# Patient Record
Sex: Male | Born: 1965 | Hispanic: No | Marital: Married | Smoking: Former smoker
Health system: Southern US, Community
[De-identification: ages and names within clinical notes are randomized; demographics above are authoritative.]

## PROBLEM LIST (undated history)

## (undated) DIAGNOSIS — D125 Benign neoplasm of sigmoid colon: Secondary | ICD-10-CM

## (undated) DIAGNOSIS — Z1211 Encounter for screening for malignant neoplasm of colon: Secondary | ICD-10-CM

## (undated) DIAGNOSIS — N200 Calculus of kidney: Secondary | ICD-10-CM

## (undated) HISTORY — PX: NO PAST SURGERIES: SHX2092

## (undated) HISTORY — DX: Encounter for screening for malignant neoplasm of colon: Z12.11

## (undated) HISTORY — DX: Benign neoplasm of sigmoid colon: D12.5

---

## 2006-02-06 ENCOUNTER — Ambulatory Visit: Payer: Self-pay

## 2006-10-25 ENCOUNTER — Emergency Department: Payer: Self-pay | Admitting: Emergency Medicine

## 2008-09-03 IMAGING — CT CT STONE STUDY
1 of 2 series · 16 of 32 positions shown, 20 images · non-contrast
Comparison: none

REASON FOR EXAM: severe pain, LLQ, L flank, no hematuria, evaluate for
stone
COMMENTS:

PROCEDURE:     CT  - CT ABDOMEN /PELVIS WO (STONE)  - October 26, 2006  [DATE]
RESULT:
HISTORY: Pain.

[Series 2: stone · axial · 0.72mm/px · z∈[-152,+272]mm · 16 of 153 slices shown, 20 images]
[im 6/153  soft-tissue]
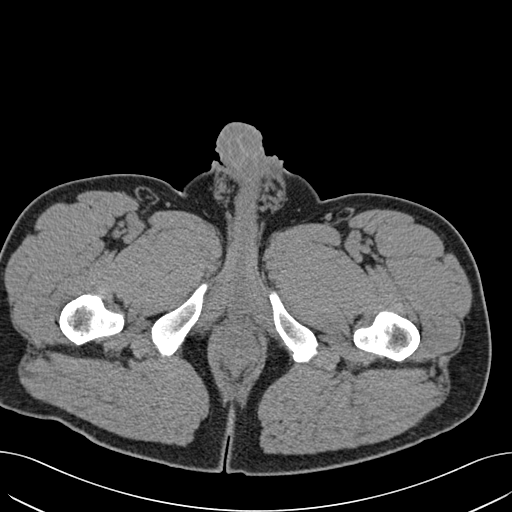
[im 6/153  bone]
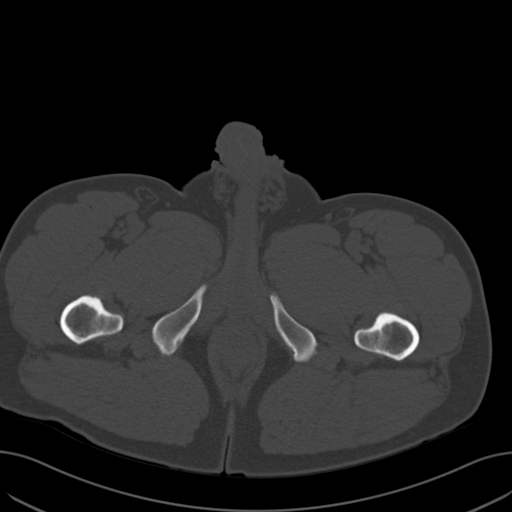
[im 17/153  soft-tissue]
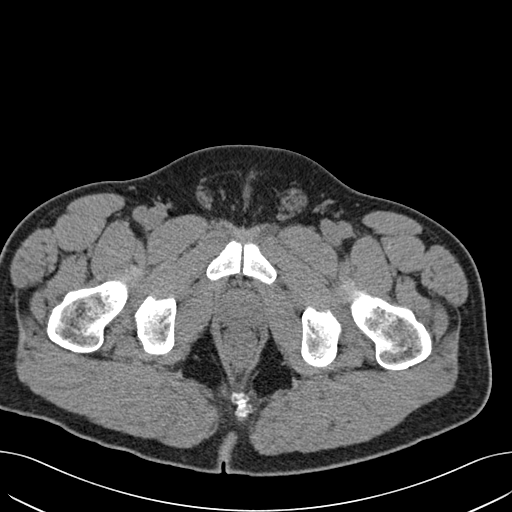
[im 28/153  soft-tissue]
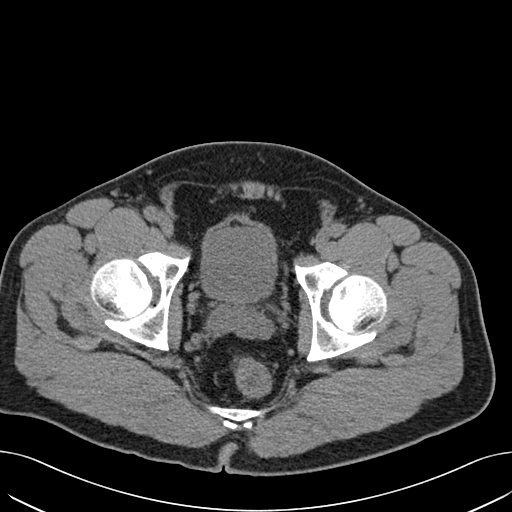
[im 39/153  soft-tissue]
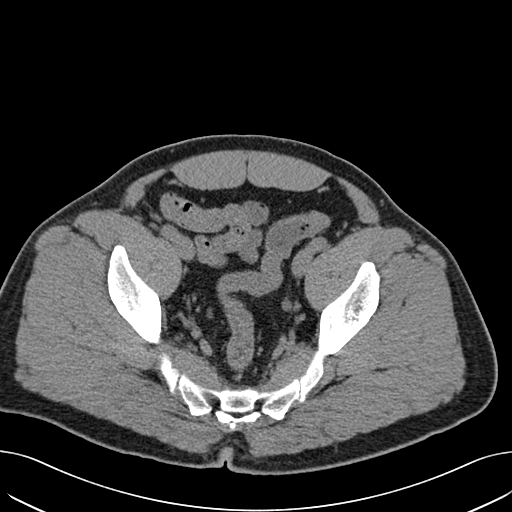
[im 49/153  soft-tissue]
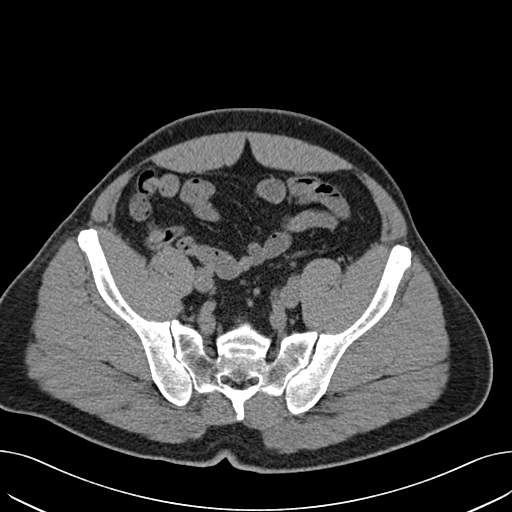
[im 60/153  soft-tissue]
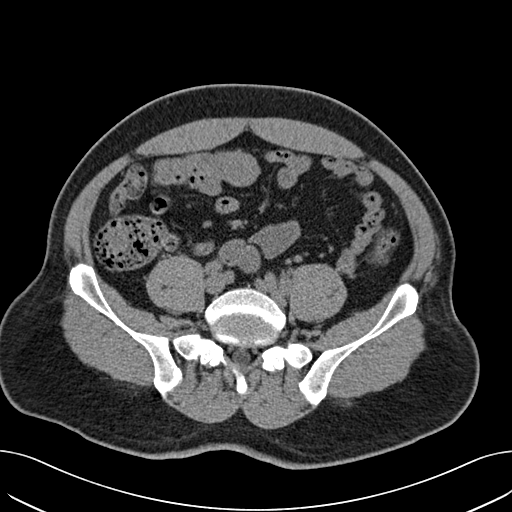
[im 71/153  soft-tissue]
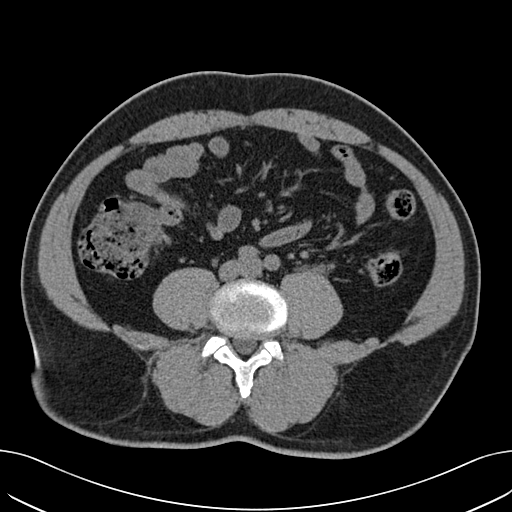
[im 82/153  soft-tissue]
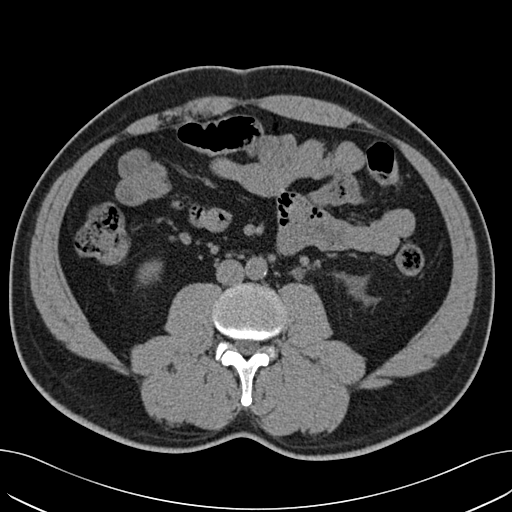
[im 93/153  soft-tissue]
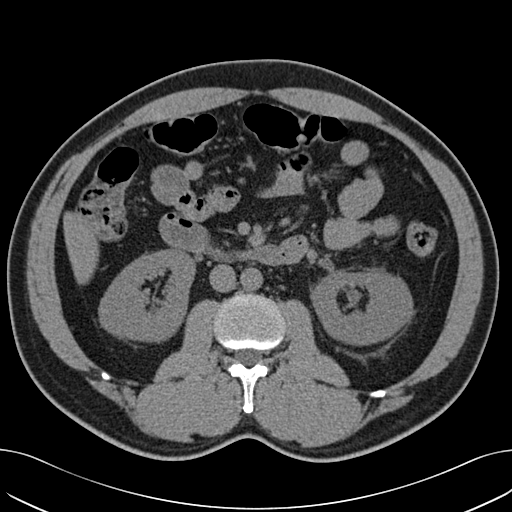
[im 93/153  bone]
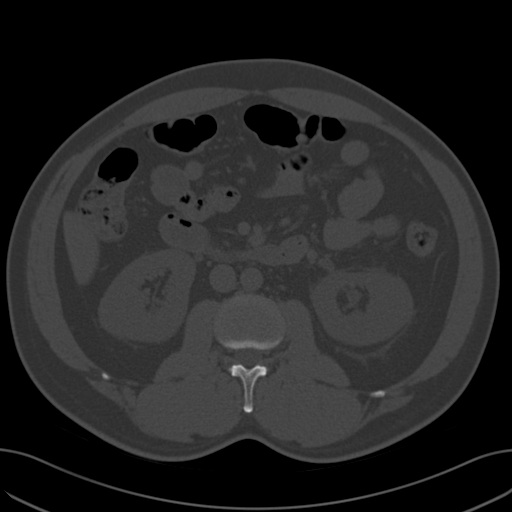
[im 104/153  soft-tissue]
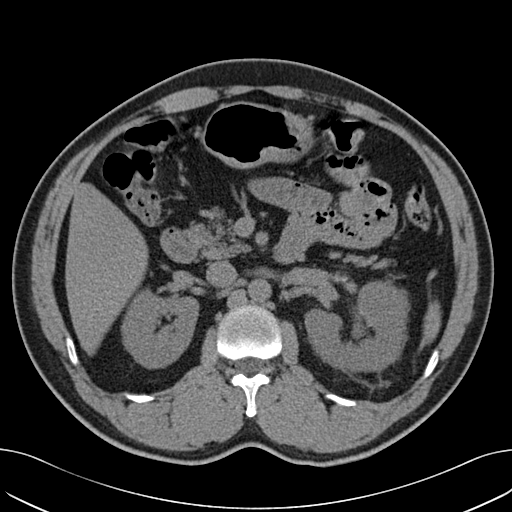
[im 115/153  soft-tissue]
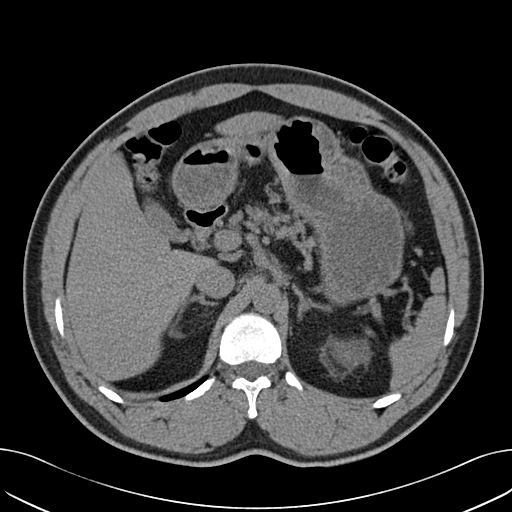
[im 125/153  soft-tissue]
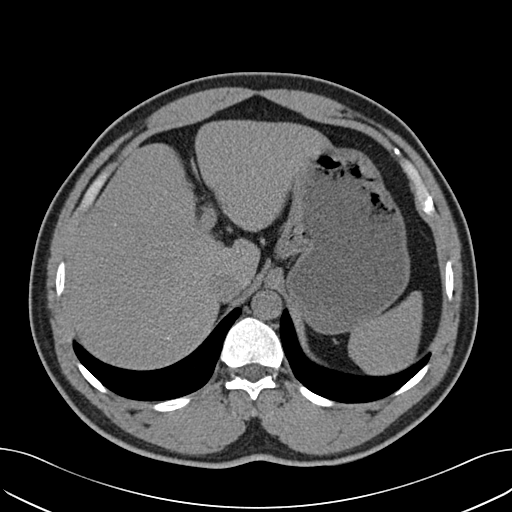
[im 131/153  lung]
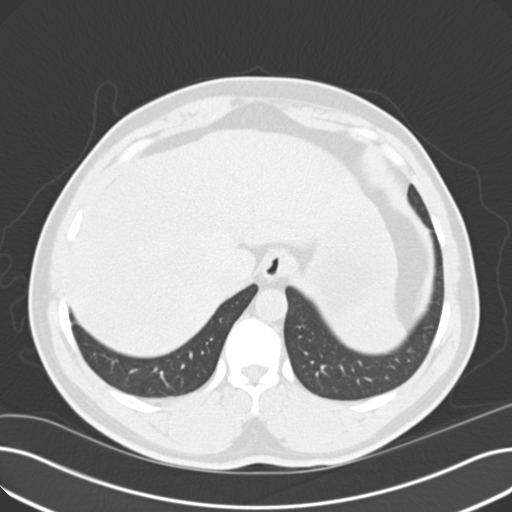
[im 136/153  soft-tissue]
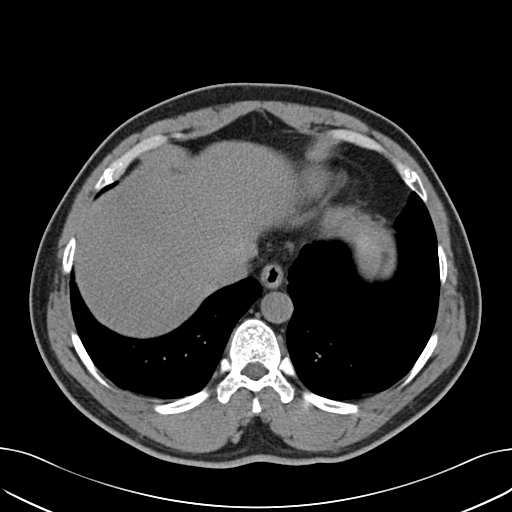
[im 136/153  lung]
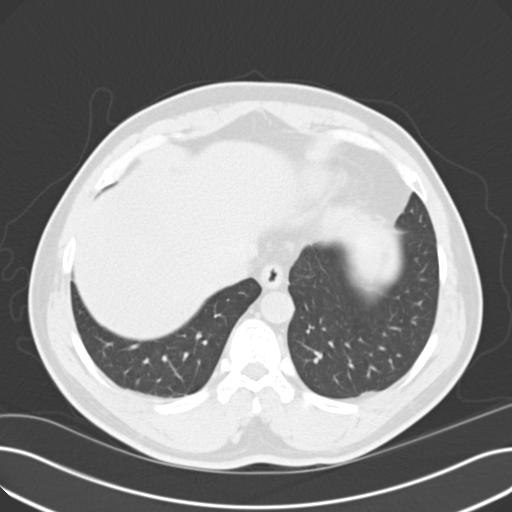
[im 142/153  lung]
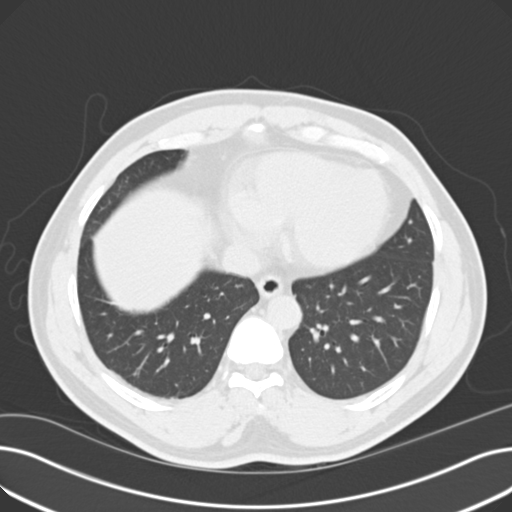
[im 147/153  soft-tissue]
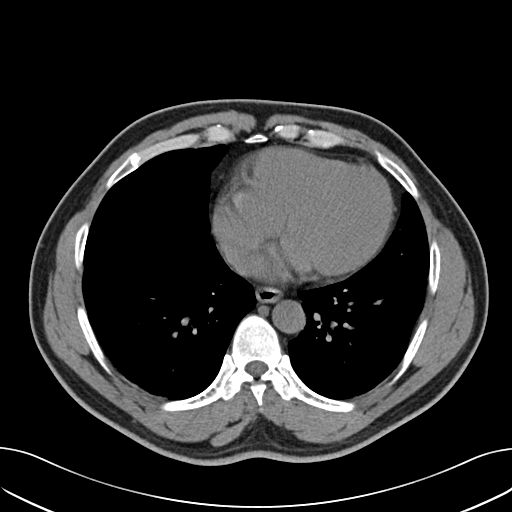
[im 147/153  lung]
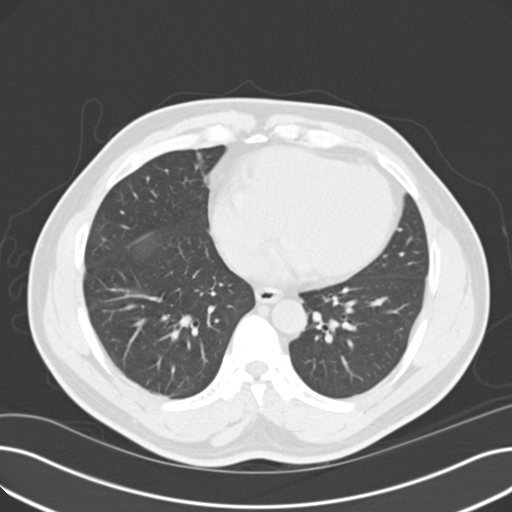

[16 of 32 positions shown; findings below may reference images not displayed]

COMPARISON STUDIES: No recent.

PROCEDURE AND FINDINGS: The liver and spleen are normal. The pancreas is
normal. The adrenals are normal.  The lung bases are clear. No bowel
distention is noted. The RIGHT lower quadrant is unremarkable.  No free
pelvic fluid is noted.  Calcification in the pelvis is noted.  There is a
distal LEFT ureteral 2 mm stone with minimal hydronephrosis on the LEFT.
Mild LEFT perirenal streaking is noted.
IMPRESSION: 1)Distal LEFT ureteral 2 mm stone is almost in the bladder.  There is
associated mild LEFT hydronephrosis.

## 2013-12-13 ENCOUNTER — Emergency Department: Payer: Self-pay | Admitting: Student

## 2015-09-29 ENCOUNTER — Telehealth: Payer: Self-pay | Admitting: Gastroenterology

## 2015-09-29 NOTE — Telephone Encounter (Signed)
Colonoscopy Please get insurance information when you call to triage so I can put it to the chart.

## 2015-09-29 NOTE — Telephone Encounter (Signed)
I got the insurance information.

## 2015-10-09 NOTE — Telephone Encounter (Signed)
Called patient and left a voicemail 

## 2015-10-12 NOTE — Telephone Encounter (Signed)
Number on file is the wrong number. I will send a notification letter to his address

## 2015-10-19 ENCOUNTER — Telehealth: Payer: Self-pay | Admitting: Gastroenterology

## 2015-10-19 NOTE — Telephone Encounter (Signed)
Patient is returning your call (letter) regarding a colonoscopy. I put the correct number in EPIC

## 2015-10-20 NOTE — Telephone Encounter (Signed)
Patient will call me back in 20 minutes. I let him know that I might be on the line with another colonoscopy, in which case, I will call him back on Tuesday.
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
## 2015-10-24 ENCOUNTER — Other Ambulatory Visit: Payer: Self-pay

## 2015-10-24 ENCOUNTER — Telehealth: Payer: Self-pay

## 2015-10-24 MED ORDER — NA SULFATE-K SULFATE-MG SULF 17.5-3.13-1.6 GM/177ML PO SOLN
1.0000 | Freq: Once | ORAL | 0 refills | Status: AC
Start: 2015-10-24 — End: 2015-10-24

## 2015-10-24 NOTE — Telephone Encounter (Signed)
Scheduled patient for 10/30/2015. Instructions were emailed to [email or web address]. Suprep was sent into their pharmacy.

## 2015-10-24 NOTE — Telephone Encounter (Signed)
Gastroenterology Pre-Procedure Review  Request Date: 10/30/2015 Requesting Physician:   PATIENT REVIEW QUESTIONS: The patient responded to the following health history questions as indicated:    1. Are you having any GI issues? no 2. Do you have a personal history of Polyps? no 3. Do you have a family history of Colon Cancer or Polyps? no 4. Diabetes Mellitus? no 5. Joint replacements in the past 12 months?no 6. Major health problems in the past 3 months?no 7. Any artificial heart valves, MVP, or defibrillator?no    MEDICATIONS & ALLERGIES:    Patient reports the following regarding taking any anticoagulation/antiplatelet therapy:   Plavix, Coumadin, Eliquis, Xarelto, Lovenox, Pradaxa, Brilinta, or Effient? no Aspirin? no  Patient confirms/reports the following medications:  No current outpatient prescriptions on file.   No current facility-administered medications for this visit.     Patient confirms/reports the following allergies:  No Known Allergies  No orders of the defined types were placed in this encounter.   AUTHORIZATION INFORMATION Primary Insurance: 1D#: Group #:  Secondary Insurance: 1D#: Group #:  SCHEDULE INFORMATION: Date: 10/30/2015 Time: Location: [facility]

## 2015-10-24 NOTE — Telephone Encounter (Signed)
Screening Colonoscopy Z12.11 [facility] [id number] Pre cert

## 2015-10-25 ENCOUNTER — Other Ambulatory Visit: Payer: Self-pay

## 2015-10-25 DIAGNOSIS — Z1211 Encounter for screening for malignant neoplasm of colon: Secondary | ICD-10-CM

## 2015-10-25 MED ORDER — PEG 3350-KCL-NABCB-NACL-NASULF 236 G PO SOLR: 4000.0000 mL | Freq: Once | ORAL | 0 refills | Status: AC

## 2015-10-26 ENCOUNTER — Encounter: Payer: Self-pay | Admitting: *Deleted

## 2015-10-27 NOTE — Discharge Instructions (Signed)

## 2015-10-30 ENCOUNTER — Ambulatory Visit: Payer: BLUE CROSS/BLUE SHIELD | Admitting: Anesthesiology

## 2015-10-30 ENCOUNTER — Ambulatory Visit
Admission: RE | Admit: 2015-10-30 | Discharge: 2015-10-30 | Disposition: A | Discharge status: Home / Self Care | Payer: BLUE CROSS/BLUE SHIELD | Source: Ambulatory Visit | Attending: Gastroenterology | Admitting: Gastroenterology

## 2015-10-30 ENCOUNTER — Encounter
Admission: RE | Disposition: A | Discharge status: Home / Self Care | Payer: Self-pay | Source: Ambulatory Visit | Attending: Gastroenterology

## 2015-10-30 DIAGNOSIS — Z87442 Personal history of urinary calculi: Secondary | ICD-10-CM | POA: Insufficient documentation

## 2015-10-30 DIAGNOSIS — K5730 Diverticulosis of large intestine without perforation or abscess without bleeding: Secondary | ICD-10-CM | POA: Diagnosis not present

## 2015-10-30 DIAGNOSIS — D123 Benign neoplasm of transverse colon: Secondary | ICD-10-CM | POA: Diagnosis not present

## 2015-10-30 DIAGNOSIS — K641 Second degree hemorrhoids: Secondary | ICD-10-CM | POA: Diagnosis not present

## 2015-10-30 DIAGNOSIS — Z87891 Personal history of nicotine dependence: Secondary | ICD-10-CM | POA: Insufficient documentation

## 2015-10-30 DIAGNOSIS — D125 Benign neoplasm of sigmoid colon: Secondary | ICD-10-CM

## 2015-10-30 DIAGNOSIS — D126 Benign neoplasm of colon, unspecified: Secondary | ICD-10-CM

## 2015-10-30 DIAGNOSIS — Z1211 Encounter for screening for malignant neoplasm of colon: Secondary | ICD-10-CM

## 2015-10-30 HISTORY — DX: Calculus of kidney: N20.0

## 2015-10-30 HISTORY — PX: COLONOSCOPY WITH PROPOFOL: SHX5780

## 2015-10-30 HISTORY — PX: POLYPECTOMY: SHX5525

## 2015-10-30 SURGERY — COLONOSCOPY WITH PROPOFOL
Anesthesia: Monitor Anesthesia Care | Wound class: Contaminated

## 2015-10-30 MED ORDER — LIDOCAINE HCL (CARDIAC) 20 MG/ML IV SOLN
INTRAVENOUS | Status: DC | PRN
  Administered 2015-10-30: 40 mg via INTRAVENOUS

## 2015-10-30 MED ORDER — SIMETHICONE 40 MG/0.6ML PO SUSP
ORAL | Status: DC | PRN
  Administered 2015-10-30: 12:00:00

## 2015-10-30 MED ORDER — LACTATED RINGERS IV SOLN
INTRAVENOUS | Status: DC
  Administered 2015-10-30: 11:00:00 via INTRAVENOUS

## 2015-10-30 MED ORDER — PROPOFOL 10 MG/ML IV BOLUS
INTRAVENOUS | Status: DC | PRN
  Administered 2015-10-30: 100 mg via INTRAVENOUS

## 2015-10-30 MED ORDER — SODIUM CHLORIDE 0.9 % IV SOLN: INTRAVENOUS | Status: DC

## 2015-10-30 SURGICAL SUPPLY — 23 items

## 2015-10-30 NOTE — H&P (Signed)
  Lucilla Lame, MD [facility] [address], [place], [place] [address] Phone: [phone] Fax : [phone]  Primary Care Physician:  No primary care provider on file. Primary Gastroenterologist:  Dr. Allen Norris  Pre-Procedure History & Physical: HPI:  Marcus George is a 50 y.o. male is here for a screening colonoscopy.   Past Medical History:  Diagnosis Date  . Kidney stone     Past Surgical History:  Procedure Laterality Date  . NO PAST SURGERIES      Prior to Admission medications   Not on File    Allergies as of 10/24/2015  . (No Known Allergies)    History reviewed. No pertinent family history.  Social History   Social History  . Marital status: Married    Spouse name: N/A  . Number of children: N/A  . Years of education: N/A   Occupational History  . Not on file.   Social History Main Topics  . Smoking status: Former Smoker    Quit date: 02/19/2012  . Smokeless tobacco: Never Used  . Alcohol use 0.6 oz/week    1 Shots of liquor per week  . Drug use: Unknown  . Sexual activity: Not on file   Other Topics Concern  . Not on file   Social History Narrative  . No narrative on file    Review of Systems: See HPI, otherwise negative ROS  Physical Exam: BP 124/90   Pulse 68   Temp 97.7 F (36.5 C) (Tympanic)   Resp 16   Ht 5\' 1"  (1.549 m)   Wt 174 lb (78.9 kg)   SpO2 99%   BMI 32.88 kg/m  General:   Alert,  pleasant and cooperative in NAD Head:  Normocephalic and atraumatic. Neck:  Supple; no masses or thyromegaly. Lungs:  Clear throughout to auscultation.    Heart:  Regular rate and rhythm. Abdomen:  Soft, nontender and nondistended. Normal bowel sounds, without guarding, and without rebound.   Neurologic:  Alert and  oriented x4;  grossly normal neurologically.  Impression/Plan: Marcus George is now here to undergo a screening colonoscopy.  Risks, benefits, and alternatives regarding colonoscopy have been reviewed with the patient.  Questions have  been answered.  All parties agreeable.

## 2015-10-30 NOTE — Anesthesia Preprocedure Evaluation (Signed)
Anesthesia Evaluation  Patient identified by MRN, date of birth, ID band Patient awake    Reviewed: Allergy & Precautions, H&P , NPO status , Patient's Chart, lab work & pertinent test results  Airway Mallampati: II  TM Distance: >3 FB Neck ROM: full    Dental no notable dental hx.    Pulmonary former smoker,    Pulmonary exam normal        Cardiovascular Normal cardiovascular exam     Neuro/Psych    GI/Hepatic   Endo/Other    Renal/GU      Musculoskeletal   Abdominal   Peds  Hematology   Anesthesia Other Findings   Reproductive/Obstetrics                             Anesthesia Physical Anesthesia Plan  ASA: I  Anesthesia Plan: MAC   Post-op Pain Management:    Induction:   Airway Management Planned:   Additional Equipment:   Intra-op Plan:   Post-operative Plan:   Informed Consent: I have reviewed the patients History and Physical, chart, labs and discussed the procedure including the risks, benefits and alternatives for the proposed anesthesia with the patient or authorized representative who has indicated his/her understanding and acceptance.     Plan Discussed with:   Anesthesia Plan Comments:         Anesthesia Quick Evaluation  

## 2015-10-30 NOTE — Op Note (Signed)
[facility] Gastroenterology Patient Name: Marcus George Procedure Date: 10/30/2015 11:48 AM MRN: [id number] Account #: [id number] Date of Birth: Jun 08, 1965 Admit Type: Outpatient Age: 50 Room: [facility] OR ROOM 01 Gender: Male Note Status: Finalized Procedure:            Colonoscopy Indications:          Screening for colorectal malignant neoplasm Providers:            Lucilla Lame MD, MD Medicines:            Propofol per Anesthesia Complications:        No immediate complications. Procedure:            Pre-Anesthesia Assessment:                       - Prior to the procedure, a History and Physical was                        performed, and patient medications and allergies were                        reviewed. The patient's tolerance of previous                        anesthesia was also reviewed. The risks and benefits of                        the procedure and the sedation options and risks were                        discussed with the patient. All questions were                        answered, and informed consent was obtained. Prior                        Anticoagulants: The patient has taken no previous                        anticoagulant or antiplatelet agents. ASA Grade                        Assessment: II - A patient with mild systemic disease.                        After reviewing the risks and benefits, the patient was                        deemed in satisfactory condition to undergo the                        procedure.                       After obtaining informed consent, the colonoscope was                        passed under direct vision. Throughout the procedure,                        the patient's blood pressure,  pulse, and oxygen                        saturations were monitored continuously. The Olympus                        CF-HQ190L Colonoscope (S#. [id number]) was introduced                        through the anus and advanced to the the  cecum,                        identified by appendiceal orifice and ileocecal valve.                        The colonoscopy was performed without difficulty. The                        patient tolerated the procedure well. The quality of                        the bowel preparation was good. Findings:      The perianal and digital rectal examinations were normal.      A 6 mm polyp was found in the sigmoid colon. The polyp was pedunculated.       The polyp was removed with a cold snare. Resection and retrieval were       complete.      Two pedunculated polyps were found in the hepatic flexure. The polyps       were 4 to 7 mm in size. These polyps were removed with a cold snare.       Resection and retrieval were complete.      Non-bleeding internal hemorrhoids were found during retroflexion. The       hemorrhoids were Grade II (internal hemorrhoids that prolapse but reduce       spontaneously).      A few small-mouthed diverticula were found in the ascending colon. Impression:           - One 6 mm polyp in the sigmoid colon, removed with a                        cold snare. Resected and retrieved.                       - Two 4 to 7 mm polyps at the hepatic flexure, removed                        with a cold snare. Resected and retrieved.                       - Non-bleeding internal hemorrhoids.                       - Diverticulosis in the ascending colon. Recommendation:       - Await pathology results.                       - Repeat colonoscopy in 5 years if polyp adenoma and 10  years if hyperplastic Procedure Code(s):    --- Professional ---                       [phone], Colonoscopy, flexible; with removal of tumor(s),                        polyp(s), or other lesion(s) by snare technique Diagnosis Code(s):    --- Professional ---                       Z12.11, Encounter for screening for malignant neoplasm                        of colon                        D12.5, Benign neoplasm of sigmoid colon                       D12.3, Benign neoplasm of transverse colon (hepatic                        flexure or splenic flexure) CPT copyright 2016 American Medical Association. All rights reserved. The codes documented in this report are preliminary and upon coder review may  be revised to meet current compliance requirements. Lucilla Lame MD, MD 10/30/2015 12:14:45 PM This report has been signed electronically. Number of Addenda: 0 Note Initiated On: 10/30/2015 11:48 AM Scope Withdrawal Time: 0 hours 6 minutes 54 seconds  Total Procedure Duration: 0 hours 11 minutes 47 seconds       [facility]

## 2015-10-30 NOTE — Anesthesia Procedure Notes (Signed)
Procedure Name: MAC Performed by: Kaesha Kirsch Pre-anesthesia Checklist: Patient identified, Emergency Drugs available, Suction available, Timeout performed and Patient being monitored Patient Re-evaluated:Patient Re-evaluated prior to inductionOxygen Delivery Method: Nasal cannula Placement Confirmation: positive ETCO2     

## 2015-10-30 NOTE — Transfer of Care (Signed)
Immediate Anesthesia Transfer of Care Note  Patient: Marcus George  Procedure(s) Performed: Procedure(s): COLONOSCOPY WITH PROPOFOL (N/A)  Patient Location: PACU  Anesthesia Type: MAC  Level of Consciousness: awake, alert  and patient cooperative  Airway and Oxygen Therapy: Patient Spontanous Breathing and Patient connected to supplemental oxygen  Post-op Assessment: Post-op Vital signs reviewed, Patient's Cardiovascular Status Stable, Respiratory Function Stable, Patent Airway and No signs of Nausea or vomiting  Post-op Vital Signs: Reviewed and stable  Complications: No apparent anesthesia complications

## 2015-10-30 NOTE — Anesthesia Postprocedure Evaluation (Signed)
Anesthesia Post Note  Patient: Marcus George  Procedure(s) Performed: Procedure(s) (LRB): COLONOSCOPY WITH PROPOFOL (N/A) POLYPECTOMY  Patient location during evaluation: PACU Anesthesia Type: MAC Level of consciousness: awake and alert and oriented Pain management: satisfactory to patient Vital Signs Assessment: post-procedure vital signs reviewed and stable Respiratory status: spontaneous breathing, nonlabored ventilation and respiratory function stable Cardiovascular status: blood pressure returned to baseline and stable Postop Assessment: Adequate PO intake and No signs of nausea or vomiting Anesthetic complications: no    Raliegh Ip

## 2015-11-01 ENCOUNTER — Encounter: Payer: Self-pay | Admitting: Gastroenterology

## 2015-11-02 ENCOUNTER — Encounter: Payer: Self-pay | Admitting: Gastroenterology

## 2019-02-02 ENCOUNTER — Ambulatory Visit: Payer: BLUE CROSS/BLUE SHIELD | Admitting: Internal Medicine

## 2019-02-17 ENCOUNTER — Ambulatory Visit: Payer: BLUE CROSS/BLUE SHIELD | Admitting: Internal Medicine

## 2019-03-21 ENCOUNTER — Encounter: Payer: Self-pay | Admitting: Internal Medicine

## 2019-03-23 ENCOUNTER — Ambulatory Visit: Payer: BLUE CROSS/BLUE SHIELD | Admitting: Internal Medicine

## 2019-05-31 ENCOUNTER — Other Ambulatory Visit: Payer: Self-pay

## 2019-05-31 ENCOUNTER — Ambulatory Visit: Payer: BLUE CROSS/BLUE SHIELD | Attending: Internal Medicine

## 2019-05-31 DIAGNOSIS — Z23 Encounter for immunization: Secondary | ICD-10-CM

## 2019-05-31 NOTE — Progress Notes (Signed)
   Covid-19 Vaccination Clinic  Name:  Marcus George    MRN: [id number] DOB: 1966-02-12  05/31/2019  [name] was observed post Covid-19 immunization for 15 minutes without incident. He was provided with Vaccine Information Sheet and instruction to access the V-Safe system.   [name] was instructed to call 911 with any severe reactions post vaccine: Marland Kitchen Difficulty breathing  . Swelling of face and throat  . A fast heartbeat  . A bad rash all over body  . Dizziness and weakness   Immunizations Administered    Name Date Dose VIS Date Route   Pfizer COVID-19 Vaccine 05/31/2019  8:57 AM 0.3 mL 01/29/2019 Intramuscular   Manufacturer: [place]   Lot: [id number]   [place]: [id number]

## 2019-06-29 ENCOUNTER — Ambulatory Visit: Payer: BLUE CROSS/BLUE SHIELD | Attending: Internal Medicine

## 2019-06-29 DIAGNOSIS — Z23 Encounter for immunization: Secondary | ICD-10-CM

## 2019-06-29 NOTE — Progress Notes (Signed)
   Covid-19 Vaccination Clinic  Name:  Marcus George    MRN: [id number] DOB: [birth year]  06/29/2019  [name] was observed post Covid-19 immunization for 15 minutes without incident. He was provided with Vaccine Information Sheet and instruction to access the V-Safe system.   [name] was instructed to call 911 with any severe reactions post vaccine: Marland Kitchen Difficulty breathing  . Swelling of face and throat  . A fast heartbeat  . A bad rash all over body  . Dizziness and weakness   Immunizations Administered    Name Date Dose VIS Date Route   Pfizer COVID-19 Vaccine 06/29/2019  8:25 AM 0.3 mL 04/14/2018 Intramuscular   Manufacturer: [place]   Lot: [id number]   [place]: [id number]

## 2023-05-23 ENCOUNTER — Ambulatory Visit: Payer: Self-pay | Admitting: Physician Assistant
